# Patient Record
Sex: Male | Born: 1965 | Marital: Married
Health system: Southern US, Community
[De-identification: ages and names within clinical notes are randomized; demographics above are authoritative.]

---

## 2013-12-25 ENCOUNTER — Other Ambulatory Visit (HOSPITAL_BASED_OUTPATIENT_CLINIC_OR_DEPARTMENT_OTHER): Payer: Self-pay | Admitting: Internal Medicine

## 2013-12-25 ENCOUNTER — Ambulatory Visit (HOSPITAL_BASED_OUTPATIENT_CLINIC_OR_DEPARTMENT_OTHER)
Admission: RE | Admit: 2013-12-25 | Discharge: 2013-12-25 | Disposition: A | Discharge status: Home / Self Care | Payer: 59 | Source: Ambulatory Visit | Attending: Internal Medicine | Admitting: Internal Medicine

## 2013-12-25 DIAGNOSIS — I82401 Acute embolism and thrombosis of unspecified deep veins of right lower extremity: Secondary | ICD-10-CM

## 2013-12-25 DIAGNOSIS — M79604 Pain in right leg: Secondary | ICD-10-CM

## 2013-12-25 DIAGNOSIS — M79609 Pain in unspecified limb: Secondary | ICD-10-CM | POA: Insufficient documentation

## 2015-06-09 IMAGING — US US EXTREM LOW VENOUS*R*
1 series · 14 of 23 positions shown · non-contrast
Comparison: None.

CLINICAL DATA: Right calf pain.

EXAM:
RIGHT LOWER EXTREMITY VENOUS DOPPLER ULTRASOUND
TECHNIQUE: Gray-scale sonography with graded compression, as well as color
Doppler and duplex ultrasound, were performed to evaluate the deep
venous system from the level of the common femoral vein through the
popliteal and proximal calf veins. Spectral Doppler was utilized to
evaluate flow at rest and with distal augmentation maneuvers.

[Series 1: us extrem low venous*right* · 0.07mm/px · 14 of 23 slices shown]
[im 1/23]
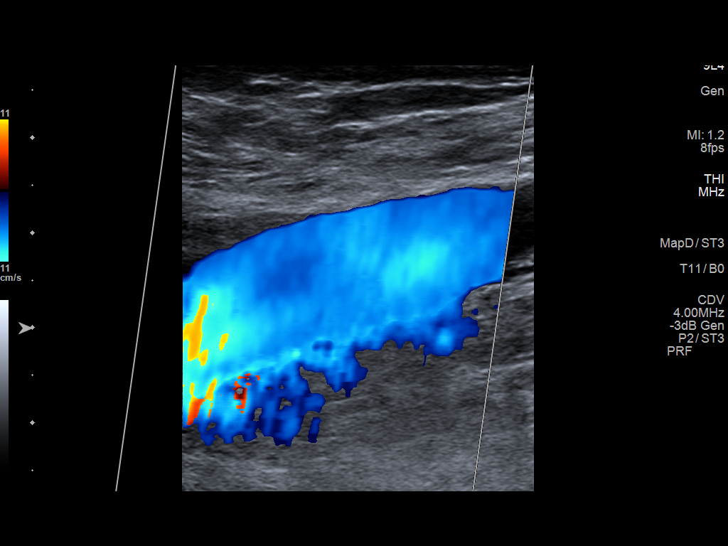
[im 3/23]
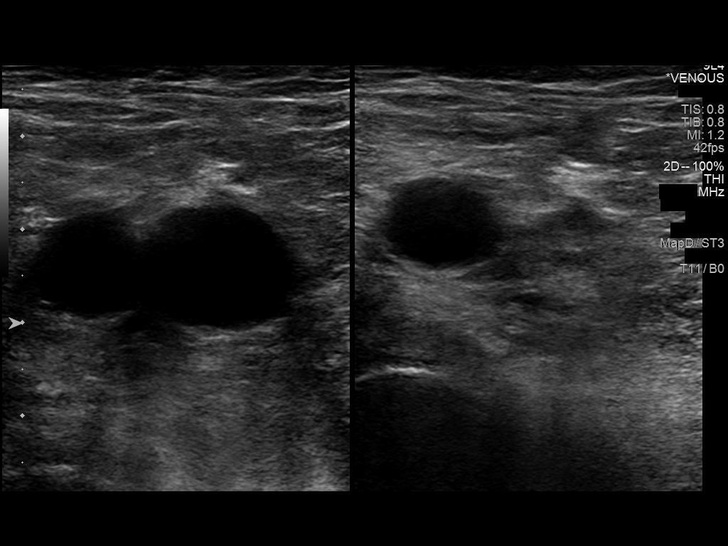
[im 5/23]
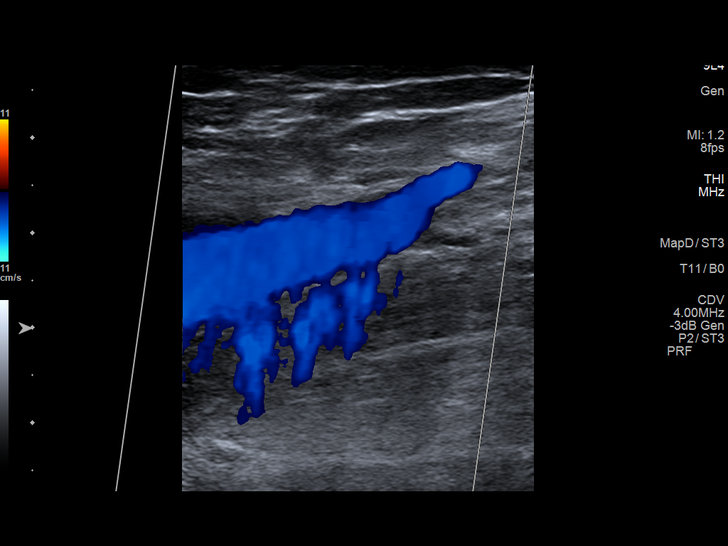
[im 6/23]
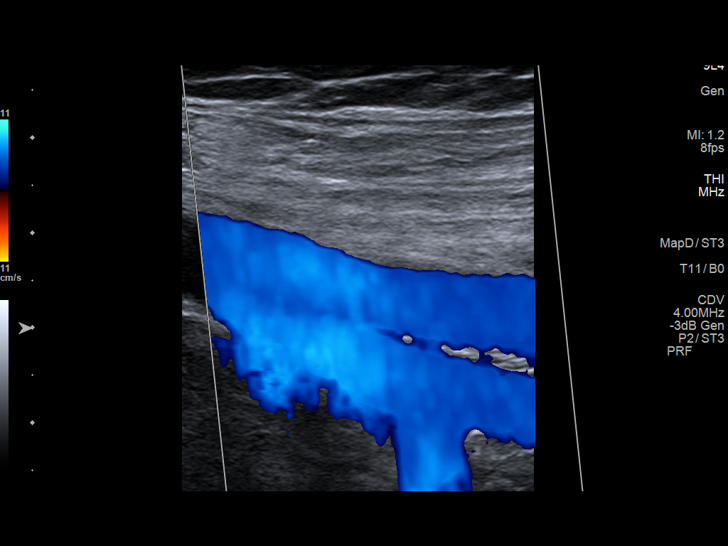
[im 8/23]
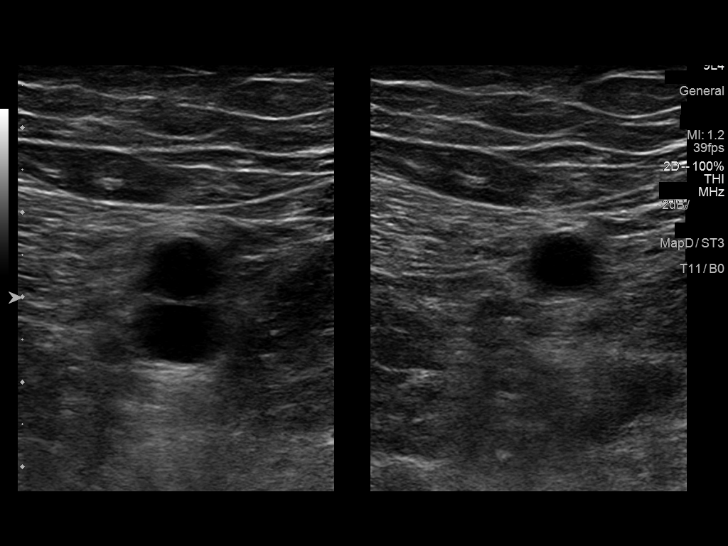
[im 10/23]
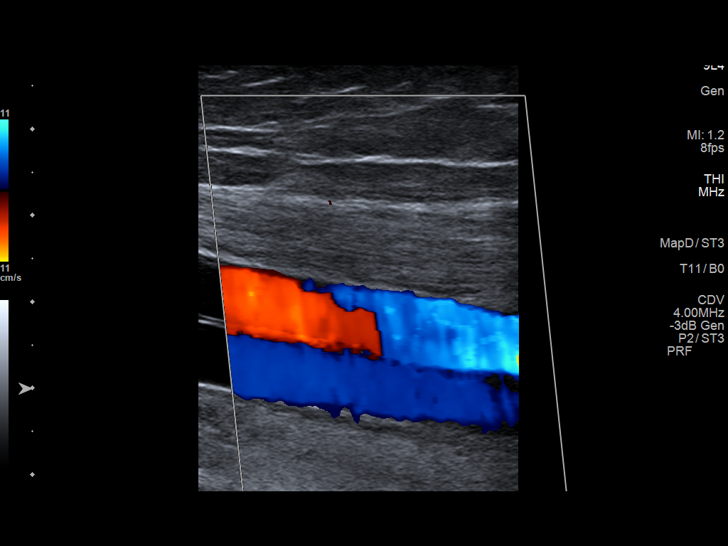
[im 11/23]
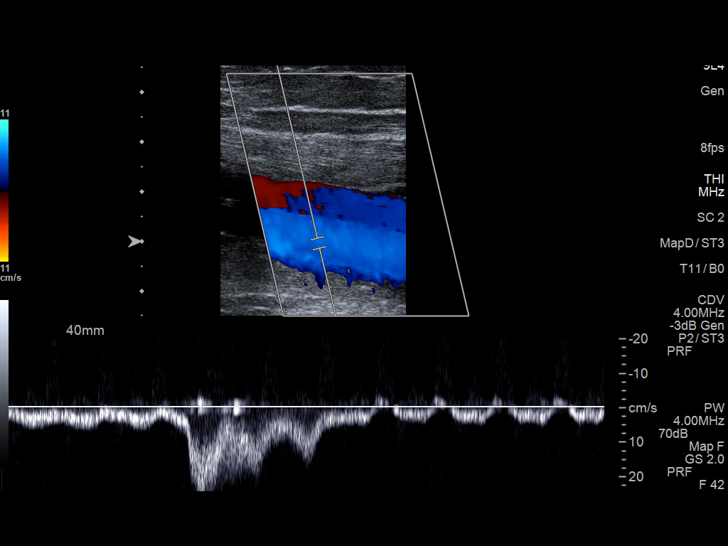
[im 13/23]
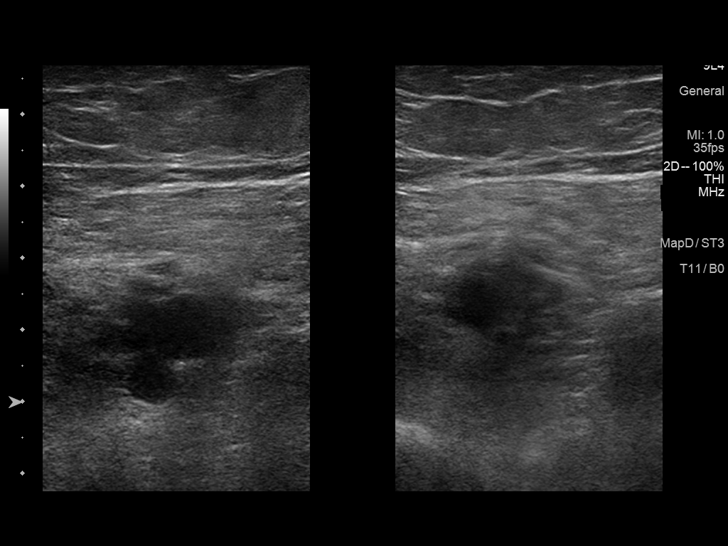
[im 14/23]
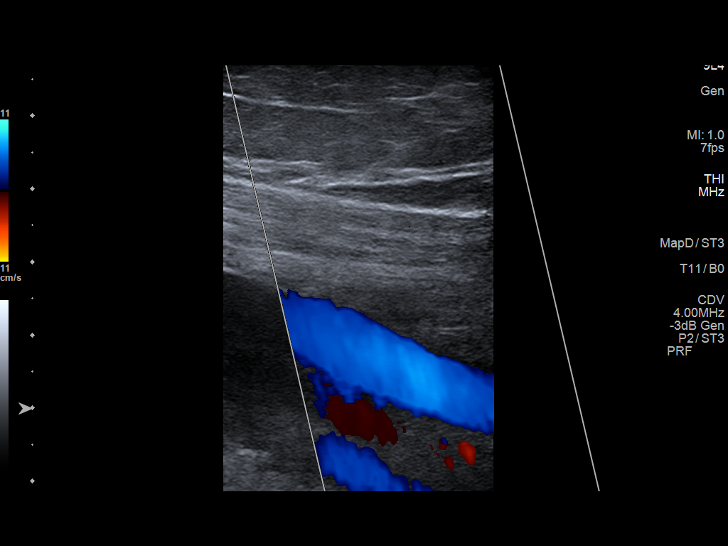
[im 16/23]
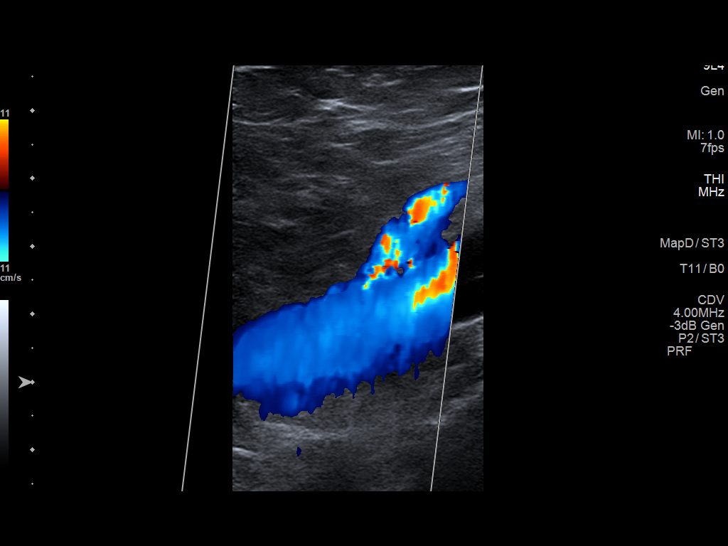
[im 18/23]
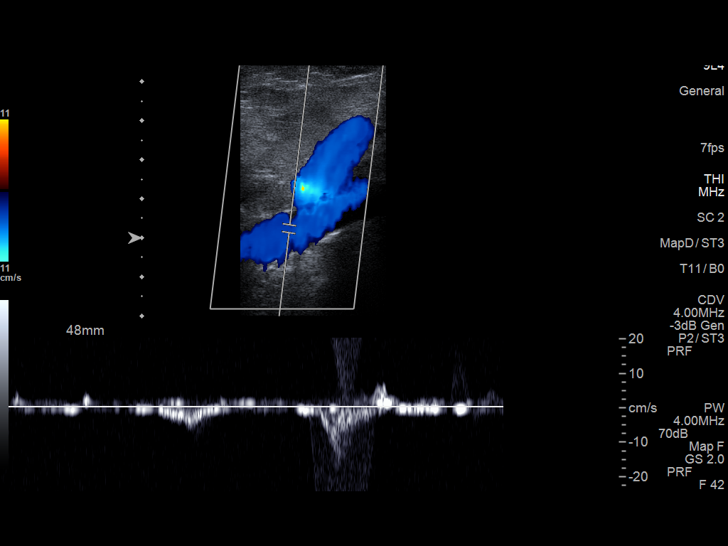
[im 19/23]
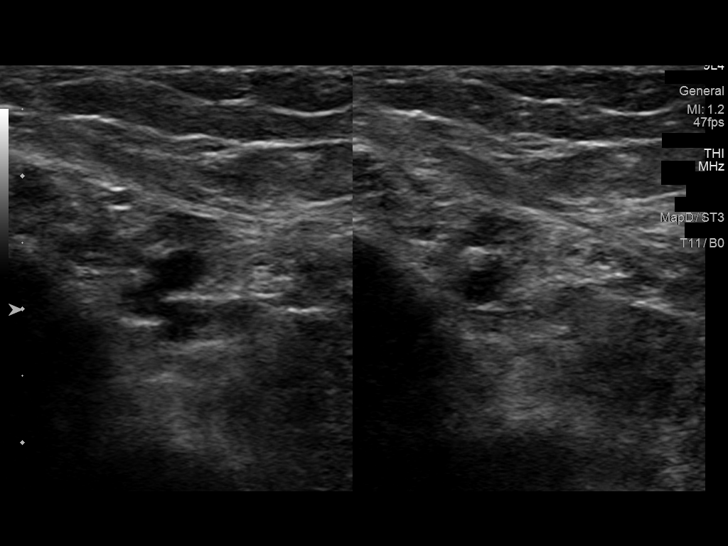
[im 21/23]
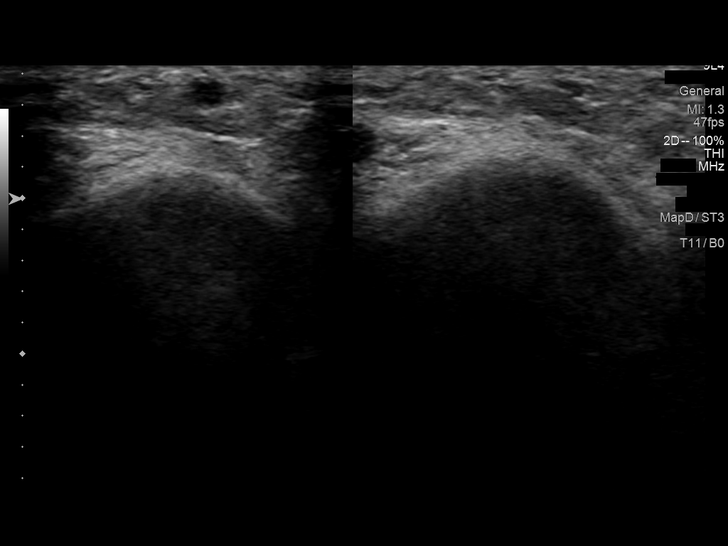
[im 23/23]
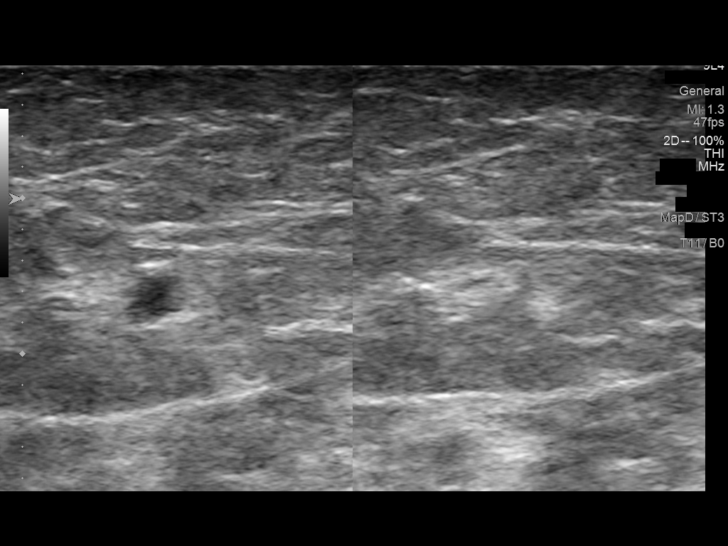

[14 of 23 positions shown; findings below may reference images not displayed]

FINDINGS: Normal compressibility, augmentation and color Doppler flow in the
right common femoral vein, right femoral vein and right popliteal
vein. Incidentally, there appears to be duplication of the distal
femoral vein. Visualized calf veins are patent. The right great
saphenous vein is patent.
IMPRESSION: Negative for right lower extremity deep vein thrombosis.
